# Patient Record
Sex: Male | Born: 1985 | Race: White | Hispanic: Yes | Smoking: Never smoker
Health system: Southern US, Community
[De-identification: ages and names within clinical notes are randomized; demographics above are authoritative.]

## PROBLEM LIST (undated history)

## (undated) DIAGNOSIS — K219 Gastro-esophageal reflux disease without esophagitis: Secondary | ICD-10-CM

## (undated) HISTORY — DX: Gastro-esophageal reflux disease without esophagitis: K21.9

---

## 2015-02-15 ENCOUNTER — Encounter: Payer: Self-pay | Admitting: Gastroenterology

## 2015-04-04 ENCOUNTER — Encounter: Payer: Self-pay | Admitting: Gastroenterology

## 2015-04-04 ENCOUNTER — Ambulatory Visit (INDEPENDENT_AMBULATORY_CARE_PROVIDER_SITE_OTHER): Payer: Self-pay | Admitting: Gastroenterology

## 2015-04-04 VITALS — BP 118/80 | HR 60 | Ht 63.5 in | Wt 146.5 lb

## 2015-04-04 DIAGNOSIS — Z8619 Personal history of other infectious and parasitic diseases: Secondary | ICD-10-CM

## 2015-04-04 DIAGNOSIS — R1013 Epigastric pain: Secondary | ICD-10-CM

## 2015-04-04 NOTE — Progress Notes (Signed)
HPI :  29 y/o male here for consultation for abdominal pain. History through translator present today.   He endorses epigastric pain. This has been bothering him for roughly 5-6 months or so. He reports it is present most days, but severity can fluctuate. He reports in the morning he endorses white phglem that comes up in his mouth but denies regurgitation of food. He also awakens with a bad taste in the mouth. Eating does not make it worse. He reports some nausea in the morning but no vomiting. Eating okay but he feels full easily, and not much appetite. No weight loss. He denies any problems with diarrhea, but passing some hard stools. He has one BM per day. No blood in the stools. He reports he was given pantoprazole q day, prior to meal by PCM. He does not think it helped much. He reports he was also given diclofenac for his abdominal pain, once daily, for one month. He stopped diclofenac 2 weeks ago. He has been on pantoprazole for roughly a few months and stopped it a week ago.    He has not had any other evaluation such as endoscopy or CT scan. He reports he had a CXR which appeared normal. He reports he has had some blood tests done which I have no records of today.  He states he was tested for H pylori and was treated for this, but his symptoms were unchanged. He denies any FH of stomach cancer. His father is with him today who had several questions.    No past medical history on file. No PMH. Reported H pylori   No past surgical history on file. None No family history on file. No GI malignancies known Social History  Substance Use Topics  . Smoking status: Never Smoker   . Smokeless tobacco: Never Used  . Alcohol Use: No   No current outpatient prescriptions on file.   No current facility-administered medications for this visit.   Allergies no known allergies   Review of Systems: All systems reviewed and negative except where noted in HPI.    No results found. [facility] labs [facility]  [facility]  Physical Exam: BP 118/80 mmHg  Pulse 60  Ht 5' 3.5" (1.613 m)  Wt 146 lb 8 oz (66.452 kg)  BMI 25.54 kg/m2 Constitutional: Pleasant,well-developed, male in no acute distress. HEENT: Normocephalic and atraumatic. Conjunctivae are normal. No scleral icterus. Neck supple.  Cardiovascular: Normal rate, regular rhythm.  Pulmonary/chest: Effort normal and breath sounds normal. No wheezing, rales or rhonchi. Abdominal: Soft, nondistended, mild epigastric TTP without rebound or guarding, negative Carnett sign. Bowel sounds active throughout. There are no masses palpable. No hepatomegaly. Extremities: no edema Lymphadenopathy: No cervical adenopathy noted. Neurological: Alert and oriented to person place and time. Skin: Skin is warm and dry. No rashes noted. Psychiatric: Normal mood and affect. Behavior is normal.   ASSESSMENT AND PLAN: 29 y/o male presenting with 5-6 months worth of intermittent epigastric pain as described above, as well as regurgitation / acid taste in the morning.   It appears he previously tested positive for H pylori but need to obtain records from his PCM to clarify what he had done. I will request all records including lab work and imaging. He has had a trial of protonix from what I can gather however did not seem to provide benefit. However, he also reports he was told to take diclofenac daily during this time which is confounding his response to PPI,  as PUD is on the differential and this could have made it worse. At this time, recommend we obtain his old records and lab work, and hold all NSAIDs. I will try him on  omeprazole daily as some patients respond to a switch in PPI in case his symptoms are related to esophagitis or GERD. DDx includes H pylori, PUD, esophagitis, celiac, other intraabdominal pathology. I don't think he has abdominal wall pain based on exam today, negative Carnett sign. If EGD is negative, will consider CT abdomen if  he has not had this done.   The indications, risks, and benefits of EGD were explained to the patient in detail. Risks include but are not limited to bleeding, perforation, adverse reaction to medications, and cardiopulmonary compromise. Sequelae include but are not limited to the possibility of surgery, hospitalization, and mortality. The patient verbalized understanding and wished to proceed. All questions answered, referred to scheduler. Further recommendations pending results of the exam.   Ileene Patrick, MD [facility] Pager [phone]

## 2015-04-04 NOTE — Patient Instructions (Signed)
You have been scheduled for an endoscopy. Please follow written instructions given to you at your visit today. If you use inhalers (even only as needed), please bring them with you on the day of your procedure. Your physician has requested that you go to www.startemmi.com and enter the access code given to you at your visit today. This web site gives a general overview about your procedure. However, you should still follow specific instructions given to you by our office regarding your preparation for the procedure.  We have sent the following medications to your pharmacy for you to pick up at your convenience: Protonix

## 2015-04-05 ENCOUNTER — Other Ambulatory Visit: Payer: Self-pay

## 2015-04-05 DIAGNOSIS — R109 Unspecified abdominal pain: Secondary | ICD-10-CM

## 2015-04-10 ENCOUNTER — Telehealth: Payer: Self-pay

## 2015-04-10 ENCOUNTER — Other Ambulatory Visit: Payer: Self-pay

## 2015-04-10 MED ORDER — OMEPRAZOLE 40 MG PO CPDR: 40.0000 mg | DELAYED_RELEASE_CAPSULE | Freq: Every day | ORAL | Status: DC

## 2015-04-10 NOTE — Telephone Encounter (Signed)
Called and reschedule pt for 04/12/2015 at 9:00am with Dr. Adela LankArmbruster.

## 2015-04-12 ENCOUNTER — Encounter: Payer: Self-pay | Admitting: Gastroenterology

## 2015-04-12 ENCOUNTER — Ambulatory Visit (AMBULATORY_SURGERY_CENTER): Payer: Self-pay | Admitting: Gastroenterology

## 2015-04-12 VITALS — BP 108/62 | HR 67 | Temp 97.4°F | Resp 20 | Ht 63.0 in | Wt 146.0 lb

## 2015-04-12 DIAGNOSIS — R1013 Epigastric pain: Secondary | ICD-10-CM

## 2015-04-12 MED ORDER — SODIUM CHLORIDE 0.9 % IV SOLN: 500.0000 mL | INTRAVENOUS | Status: DC

## 2015-04-12 NOTE — Progress Notes (Signed)
Everything well explained with interpretor pre- procedure To RR stable awake

## 2015-04-12 NOTE — Patient Instructions (Addendum)
Impressions/recommendations:  Gastritis (handout per Dr. Adela LankArmbruster)  YOU HAD AN ENDOSCOPIC PROCEDURE TODAY AT THE [facility]:   Refer to the procedure report that was given to you for any specific questions about what was found during the examination.  If the procedure report does not answer your questions, please call your gastroenterologist to clarify.  If you requested that your care partner not be given the details of your procedure findings, then the procedure report has been included in a sealed envelope for you to review at your convenience later.  YOU SHOULD EXPECT: Some feelings of bloating in the abdomen. Passage of more gas than usual.  Walking can help get rid of the air that was put into your GI tract during the procedure and reduce the bloating. If you had a lower endoscopy (such as a colonoscopy or flexible sigmoidoscopy) you may notice spotting of blood in your stool or on the toilet paper. If you underwent a bowel prep for your procedure, you may not have a normal bowel movement for a few days.  Please Note:  You might notice some irritation and congestion in your nose or some drainage.  This is from the oxygen used during your procedure.  There is no need for concern and it should clear up in a day or so.  SYMPTOMS TO REPORT IMMEDIATELY:   Following upper endoscopy (EGD)  Vomiting of blood or coffee ground material  New chest pain or pain under the shoulder blades  Painful or persistently difficult swallowing  New shortness of breath  Fever of 100F or higher  Black, tarry-looking stools  For urgent or emergent issues, a gastroenterologist can be reached at any hour by calling [phone].   DIET: Your first meal following the procedure should be a small meal and then it is ok to progress to your normal diet. Heavy or fried foods are harder to digest and may make you feel nauseous or bloated.  Likewise, meals heavy in dairy and vegetables can increase  bloating.  Drink plenty of fluids but you should avoid alcoholic beverages for 24 hours.  ACTIVITY:  You should plan to take it easy for the rest of today and you should NOT DRIVE or use heavy machinery until tomorrow (because of the sedation medicines used during the test).    FOLLOW UP: Our staff will call the number listed on your records the next business day following your procedure to check on you and address any questions or concerns that you may have regarding the information given to you following your procedure. If we do not reach you, we will leave a message.  However, if you are feeling well and you are not experiencing any problems, there is no need to return our call.  We will assume that you have returned to your regular daily activities without incident.  If any biopsies were taken you will be contacted by phone or by letter within the next 1-3 weeks.  Please call us at [phone] if you have not heard about the biopsies in 3 weeks.    SIGNATURES/CONFIDENTIALITY: You and/or your care partner have signed paperwork which will be entered into your electronic medical record.  These signatures attest to the fact that that the information above on your After Visit Summary has been reviewed and is understood.  Full responsibility of the confidentiality of this discharge information lies with you and/or your care-partner.

## 2015-04-12 NOTE — Op Note (Signed)
[facility] [address] [place], [id number]   ENDOSCOPY PROCEDURE REPORT  PATIENT: Javier Ward, Tenzin  MR#: [id number] BIRTHDATE: 1986-03-28 , 29  yrs. old GENDER: male ENDOSCOPIST: Benancio DeedsSteven P Orval Dortch, MD REFERRED BY: PROCEDURE DATE:  04/12/2015 PROCEDURE:  EGD w/ biopsy ASA CLASS:     Class II INDICATIONS:  epigastric pain. MEDICATIONS: Propofol 200 mg IV and Lidocaine 40 mg IV TOPICAL ANESTHETIC:  DESCRIPTION OF PROCEDURE: After the risks benefits and alternatives of the procedure were thoroughly explained, informed consent was obtained.  The LB [id number] endoscope was introduced through the mouth and advanced to the second portion of the duodenum , Without limitations.  The instrument was slowly withdrawn as the mucosa was fully examined.  FINDINGS: The esophagus was normal in appearance.  DH, GEJ, and SCJ located 38cm from the incisors.  The stomach was notable for an erythematous patch of mucosa in the gastric body without focal ulceration or erosion.  The rest of the stomach appeared normal. Biopsies taken of the antrum and body to rule out H pylori.  The dudoenal bulb was normal.  A few small erosions were noted in the 2nd portion of the duodenum without focal ulceration.  Biopsies taken to rule out celiac disease.  Retroflexed views revealed no abnormalities.     The scope was then withdrawn from the patient and the procedure completed.  COMPLICATIONS: There were no immediate complications.  ENDOSCOPIC IMPRESSION: Normal esophagus Gastric erythema as described above, biopsies taken to rule out H pylori Small erosions in the 2nd portion duodenum, biopsies obtained      RECOMMENDATIONS: Resume diet Resume medications. Please go to the pharmacy to pick up omeprazole as previously recommended No NSAIDs Await pathology results.    eSigned:  Benancio DeedsSteven P Lakaya Tolen, MD 04/12/2015 10:18 AM    CC: the patient  PATIENT NAME:   Javier Ward, Zanden MR#: [id number]

## 2015-04-12 NOTE — Progress Notes (Signed)
Called to room to assist during endoscopic procedure.  Patient ID and intended procedure confirmed with present staff. Received instructions for my participation in the procedure from the performing physician.  

## 2015-04-13 ENCOUNTER — Telehealth: Payer: Self-pay | Admitting: *Deleted

## 2015-04-13 NOTE — Telephone Encounter (Signed)
  Follow up Call-  Call back number 04/12/2015  Post procedure Call Back phone  # [phone]- Javier NovemberMike his firiends #, he speaks english  Permission to leave phone message Yes     Patient questions:  Do you have a fever, pain , or abdominal swelling? No. Pain Score  0 *  Have you tolerated food without any problems? Yes.    Have you been able to return to your normal activities? Yes.    Do you have any questions about your discharge instructions: Diet   No. Medications  No. Follow up visit  No.  Do you have questions or concerns about your Care? No.  Actions: * If pain score is 4 or above: No action needed, pain <4.

## 2015-04-18 ENCOUNTER — Other Ambulatory Visit: Payer: Self-pay | Admitting: *Deleted

## 2015-04-18 ENCOUNTER — Telehealth: Payer: Self-pay | Admitting: Gastroenterology

## 2015-04-18 DIAGNOSIS — R1013 Epigastric pain: Secondary | ICD-10-CM

## 2015-04-18 NOTE — Telephone Encounter (Signed)
Spoke with Kathlene NovemberMike and gave him date and time of CT. He will pick up contrast and instructions for patient.

## 2015-04-20 ENCOUNTER — Other Ambulatory Visit: Payer: Self-pay

## 2015-04-20 ENCOUNTER — Ambulatory Visit (INDEPENDENT_AMBULATORY_CARE_PROVIDER_SITE_OTHER)
Admission: RE | Admit: 2015-04-20 | Discharge: 2015-04-20 | Disposition: A | Discharge status: Home / Self Care | Payer: Self-pay | Source: Ambulatory Visit | Attending: Gastroenterology | Admitting: Gastroenterology

## 2015-04-20 DIAGNOSIS — R1084 Generalized abdominal pain: Secondary | ICD-10-CM

## 2015-04-20 DIAGNOSIS — R1013 Epigastric pain: Secondary | ICD-10-CM

## 2015-04-20 MED ORDER — IOHEXOL 300 MG/ML  SOLN
100.0000 mL | Freq: Once | INTRAMUSCULAR | Status: AC | PRN
  Administered 2015-04-20: 100 mL via INTRAVENOUS

## 2015-04-21 ENCOUNTER — Encounter: Payer: Self-pay | Admitting: Gastroenterology

## 2015-04-21 ENCOUNTER — Other Ambulatory Visit (INDEPENDENT_AMBULATORY_CARE_PROVIDER_SITE_OTHER): Payer: Self-pay

## 2015-04-21 ENCOUNTER — Ambulatory Visit (INDEPENDENT_AMBULATORY_CARE_PROVIDER_SITE_OTHER): Payer: Self-pay | Admitting: Gastroenterology

## 2015-04-21 DIAGNOSIS — R1084 Generalized abdominal pain: Secondary | ICD-10-CM

## 2015-04-21 DIAGNOSIS — R1013 Epigastric pain: Secondary | ICD-10-CM

## 2015-04-21 DIAGNOSIS — R109 Unspecified abdominal pain: Secondary | ICD-10-CM

## 2015-04-21 DIAGNOSIS — G8929 Other chronic pain: Secondary | ICD-10-CM | POA: Insufficient documentation

## 2015-04-21 LAB — CBC WITH DIFFERENTIAL/PLATELET
BASOS PCT: 0.6 % (ref 0.0–3.0)
Basophils Absolute: 0 10*3/uL (ref 0.0–0.1)
EOS ABS: 0.1 10*3/uL (ref 0.0–0.7)
EOS PCT: 2.4 % (ref 0.0–5.0)
HEMATOCRIT: 46 % (ref 39.0–52.0)
HEMOGLOBIN: 15.4 g/dL (ref 13.0–17.0)
LYMPHS PCT: 32.7 % (ref 12.0–46.0)
Lymphs Abs: 2 10*3/uL (ref 0.7–4.0)
MCHC: 33.5 g/dL (ref 30.0–36.0)
MCV: 79.4 fl (ref 78.0–100.0)
MONOS PCT: 8.1 % (ref 3.0–12.0)
Monocytes Absolute: 0.5 10*3/uL (ref 0.1–1.0)
NEUTROS ABS: 3.4 10*3/uL (ref 1.4–7.7)
Neutrophils Relative %: 56.2 % (ref 43.0–77.0)
PLATELETS: 319 10*3/uL (ref 150.0–400.0)
RBC: 5.8 Mil/uL (ref 4.22–5.81)
RDW: 13.6 % (ref 11.5–15.5)
WBC: 6.1 10*3/uL (ref 4.0–10.5)

## 2015-04-21 LAB — COMPREHENSIVE METABOLIC PANEL
ALBUMIN: 4.7 g/dL (ref 3.5–5.2)
ALT: 22 U/L (ref 0–53)
AST: 17 U/L (ref 0–37)
Alkaline Phosphatase: 74 U/L (ref 39–117)
BUN: 21 mg/dL (ref 6–23)
CALCIUM: 9.7 mg/dL (ref 8.4–10.5)
CHLORIDE: 106 meq/L (ref 96–112)
CO2: 28 meq/L (ref 19–32)
CREATININE: 1.06 mg/dL (ref 0.40–1.50)
GFR: 88.97 mL/min (ref >60.00)
Glucose, Bld: 105 mg/dL — ABNORMAL HIGH (ref 70–99)
POTASSIUM: 4.3 meq/L (ref 3.5–5.1)
Sodium: 140 mEq/L (ref 135–145)
Total Bilirubin: 0.5 mg/dL (ref 0.2–1.2)
Total Protein: 7.7 g/dL (ref 6.0–8.3)

## 2015-04-21 LAB — LIPASE: LIPASE: 32 U/L (ref 11.0–59.0)

## 2015-04-21 LAB — C-REACTIVE PROTEIN: CRP: 0.4 mg/dL — AB (ref 0.5–20.0)

## 2015-04-21 LAB — SEDIMENTATION RATE: SED RATE: 7 mm/h (ref 0–22)

## 2015-04-21 LAB — IGA: IgA: 370 mg/dL (ref 68–378)

## 2015-04-21 MED ORDER — POLYETHYLENE GLYCOL 3350 17 GM/SCOOP PO POWD
ORAL | Status: AC
Start: 2015-04-21 — End: ?

## 2015-04-21 MED ORDER — TRAMADOL HCL 50 MG PO TABS: 50.0000 mg | ORAL_TABLET | Freq: Three times a day (TID) | ORAL | Status: AC | PRN

## 2015-04-21 MED ORDER — LIDOCAINE 5 % EX PTCH: 1.0000 | MEDICATED_PATCH | CUTANEOUS | Status: AC

## 2015-04-21 NOTE — Progress Notes (Signed)
HPI :  INTAKE VISIT 29 y/o male here for consultation for abdominal pain. History through translator present today.   He endorses epigastric pain. This has been bothering him for roughly 5-6 months or so. He reports it is present most days, but severity can fluctuate. He reports in the morning he endorses white phglem that comes up in his mouth but denies regurgitation of food. He also awakens with a bad taste in the mouth. Eating does not make it worse. He reports some nausea in the morning but no vomiting. Eating okay but he feels full easily, and not much appetite. No weight loss. He denies any problems with diarrhea, but passing some hard stools. He has one BM per day. No blood in the stools. He reports he was given pantoprazole q day, prior to meal by PCM. He does not think it helped much. He reports he was also given diclofenac for his abdominal pain, once daily, for one month. He stopped diclofenac 2 weeks ago. He has been on pantoprazole for roughly a few months and stopped it a week ago.   He has not had any other evaluation such as endoscopy or CT scan. He reports he had a CXR which appeared normal. He reports he has had some blood tests done which I have no records of today. He states he was tested for H pylori and was treated for this, but his symptoms were unchanged. He denies any FH of stomach cancer. His father is with him today who had several questions.   SINCE LAST VISIT  Patient here for follow up, after having EGD and CT abdomen. Since the last visit he feels about the same. Symptoms have been ongoing for 5-6 months or so now total but seems worse recently. Trial of omeprazole has not provided too much, maybe a little. Stable pain, sometimes pain is worse and having a hard time sleeping. Pain is in the upper abdomen, in the epigastric area mostly, pain can be worse with eating but not always, he is unclear if there is an association. No vomiting. In the morning he has spitting up  saliva/secretions but no blood or mucous. No dysphagia. No odynophagia. He has pain present all the time, 24/7, never goes away. Pain is rated 8/10 at max, baseline rated 5/10. He has some early satiety, although states he does not want to eat in case it makes his symptoms worse. He is constipated at baseline, has one BM per day, he strains to move his bowels. No blood in the stools. After a bowel movement his pain is not much better.    EGD - 04/12/15 - mild gastritis, biopsies show negative for H pylori Ct abdomen - increased stool burden, small 48m nodule in lower lobe, benign per radiology     Past Medical History  Diagnosis Date  . GERD (gastroesophageal reflux disease)      History reviewed. No pertinent past surgical history. Family History  Problem Relation Age of Onset  . Colon cancer Neg Hx   . Esophageal cancer Neg Hx   . Stomach cancer Neg Hx    Social History  Substance Use Topics  . Smoking status: Never Smoker   . Smokeless tobacco: Never Used  . Alcohol Use: No   Current Outpatient Prescriptions  Medication Sig Dispense Refill  . omeprazole (PRILOSEC) 40 MG capsule Take 1 capsule (40 mg total) by mouth daily. 60 capsule 1   No current facility-administered medications for this visit.   No  Known Allergies   Review of Systems: All systems reviewed and negative except where noted in HPI.    Ct Abdomen Pelvis W Contrast  04/20/2015  CLINICAL DATA:  Epigastric pain for several months. Increased recently. Constipation. EXAM: CT ABDOMEN AND PELVIS WITH CONTRAST TECHNIQUE: Multidetector CT imaging of the abdomen and pelvis was performed using the standard protocol following bolus administration of intravenous contrast. Oral contrast was also administered. CONTRAST:  151m OMNIPAQUE IOHEXOL 300 MG/ML  SOLN COMPARISON:  None. FINDINGS: Lower chest: There is a 5 mm nodular opacity in the medial segment of the right middle lobe. Lung bases are otherwise clear.  Hepatobiliary: No focal liver lesions are identified. Gallbladder wall is not appreciably thickened. There is no biliary duct dilatation. Pancreas: No pancreatic mass or inflammatory focus. Spleen: No splenic lesions are identified. Adrenals/Urinary Tract: Adrenals appear normal bilaterally. Kidneys bilaterally show no mass or hydronephrosis. There is no renal or ureteral calculus on either side. Urinary bladder is midline with wall thickness within normal limits. Stomach/Bowel: There is fairly diffuse stool throughout the colon. The colon is not distended with stool. There is no bowel wall or mesenteric thickening. No bowel obstruction. No free air or portal venous air. Vascular/Lymphatic: There is no abdominal aortic aneurysm. No vascular lesions are identified. The major mesenteric arteries appear patent. No adenopathy is appreciable in the abdomen or pelvis. Reproductive: Prostate is normal in size and configuration. There is no pelvic mass or pelvic fluid collection. Other: Appendix appears normal. There is no abscess or ascites in the abdomen or pelvis. Musculoskeletal: There are no blastic or lytic bone lesions. There is no intramuscular or abdominal wall lesion. IMPRESSION: Stool throughout colon without colonic distention due to stool. No bowel obstruction. No abscess. Appendix appears normal. No renal or ureteral calculus.  No hydronephrosis. 5 mm nodular opacity medial segment right middle lobe. In this age group, this finding is most likely benign. Electronically Signed   By: WLowella GripIII M.D.   On: 04/20/2015 10:32    Physical Exam: BP 112/76 mmHg  Pulse 72  Ht 5' 3.5" (1.613 m)  Wt 144 lb 6.4 oz (65.499 kg)  BMI 25.17 kg/m2 Constitutional: Pleasant,well-developed, male in no acute distress. HEENT: Normocephalic and atraumatic. Conjunctivae are normal. No scleral icterus. Neck supple.  Cardiovascular: Normal rate, regular rhythm.  Pulmonary/chest: Effort normal and breath sounds  normal. No wheezing, rales or rhonchi. Abdominal: Soft, nondistended, epigastric tenderness to palpation along costal margin with positive Carnett sign,. Bowel sounds active throughout. There are no masses palpable. No hepatomegaly. Extremities: no edema Lymphadenopathy: No cervical adenopathy noted. Neurological: Alert and oriented to person place and time. Skin: Skin is warm and dry. No rashes noted. Psychiatric: Normal mood and affect. Behavior is normal.   ASSESSMENT AND PLAN: 29y/o male with 5-6 months of ongoing epigastric pain as outlined above. Pain at this time is present 24/7, waxes and wanes in severity and tender to the touch, no clear reliable relation to eating perhaps sometimes, and with some early satiety. PPIs have not provided benefit. EGD showed mild nonspecific HP negative gastritis, biopses negative for celiac. CT abdomen was normal other than increased stool burden, no clear pathology noted to cause his pain. His pain on exam today was reproducible and he had a positive Carnett sign. I suspect a strong component of his pain may be due to musculoskeletal / abdominal wall pain, although he does have some ? postprandial component which would be a bit atypical.  Will try  him on a short course of Ultram and provide lidocaine patches to the abdominal wall to see if this helps. He otherwise has reported blood work from his [facility] but we were not able to obtain the results. I asked him to go to the lab for basic lab draw for CBC, CMP, ESR, CRP, lipase, and celiac serology. Otherwise, he has some mild constipation and recommend miralax daily to see if this helps, but do not think his symptoms are being driven by constipation. I reassured him regarding CT and EGD findings. I do think he has a strong component of abdominal wall pain driving this picture and we will see how he responds to this regimen, and await labs to ensure normal. Otherwise he can try some FD Donald Prose for possible dyspepsia as well  to see if this helps. If symptoms persist may consider gastric emptying scan and pain management consult for trigger point injection. He agreed.   Of note, small lung nodule noted on CT. Not related to his presentation but recommend he see his PCM about this to determine if any long term follow up for this is warranted. It appears benign per radiology. He agreed.   Bunk Foss Cellar, MD [facility] Pager [phone]

## 2015-04-21 NOTE — Patient Instructions (Signed)
We have sent  medications to your pharmacy for you to pick up at your convenience.  Your physician has requested that you go to the basement for lab work before leaving today.     

## 2015-04-24 LAB — TISSUE TRANSGLUTAMINASE, IGA: TISSUE TRANSGLUTAMINASE AB, IGA: 1 U/mL (ref <4)

## 2015-06-19 ENCOUNTER — Encounter: Payer: Self-pay | Admitting: Gastroenterology

## 2015-07-25 ENCOUNTER — Encounter: Payer: Self-pay | Admitting: Gastroenterology

## 2015-07-25 ENCOUNTER — Telehealth: Payer: Self-pay | Admitting: Gastroenterology

## 2015-07-25 ENCOUNTER — Ambulatory Visit (INDEPENDENT_AMBULATORY_CARE_PROVIDER_SITE_OTHER): Payer: Self-pay | Admitting: Gastroenterology

## 2015-07-25 VITALS — BP 116/70 | HR 64 | Ht 63.5 in | Wt 152.4 lb

## 2015-07-25 DIAGNOSIS — R0989 Other specified symptoms and signs involving the circulatory and respiratory systems: Secondary | ICD-10-CM

## 2015-07-25 DIAGNOSIS — R05 Cough: Secondary | ICD-10-CM

## 2015-07-25 DIAGNOSIS — R059 Cough, unspecified: Secondary | ICD-10-CM

## 2015-07-25 DIAGNOSIS — F458 Other somatoform disorders: Secondary | ICD-10-CM

## 2015-07-25 MED ORDER — OMEPRAZOLE 40 MG PO CPDR: 40.0000 mg | DELAYED_RELEASE_CAPSULE | Freq: Two times a day (BID) | ORAL | Status: AC

## 2015-07-25 NOTE — Patient Instructions (Signed)
We have sent the following medications to your pharmacy for you to pick up at your convenience: Omeprazole 40mg.   

## 2015-07-25 NOTE — Progress Notes (Signed)
HPI :  INTAKE VISIT 30 y/o male here for consultation for abdominal pain. History through translator present today.   He endorses epigastric pain. This has been bothering him for roughly 5-6 months or so. He reports it is present most days, but severity can fluctuate. He reports in the morning he endorses white phglem that comes up in his mouth but denies regurgitation of food. He also awakens with a bad taste in the mouth. Eating does not make it worse. He reports some nausea in the morning but no vomiting. Eating okay but he feels full easily, and not much appetite. No weight loss. He denies any problems with diarrhea, but passing some hard stools. He has one BM per day. No blood in the stools. He reports he was given pantoprazole q day, prior to meal by PCM. He does not think it helped much. He reports he was also given diclofenac for his abdominal pain, once daily, for one month. He stopped diclofenac 2 weeks ago. He has been on pantoprazole for roughly a few months and stopped it a week ago.   He has not had any other evaluation such as endoscopy or CT scan. He reports he had a CXR which appeared normal. He reports he has had some blood tests done which I have no records of today. He states he was tested for H pylori and was treated for this, but his symptoms were unchanged. He denies any FH of stomach cancer. His father is with him today who had several questions.   LAST VISIT  Patient here for follow up, after having EGD and CT abdomen. Since the last visit he feels about the same. Symptoms have been ongoing for 5-6 months or so now total but seems worse recently. Trial of omeprazole has not provided too much, maybe a little. Stable pain, sometimes pain is worse and having a hard time sleeping. Pain is in the upper abdomen, in the epigastric area mostly, pain can be worse with eating but not always, he is unclear if there is an association. No vomiting. In the morning he has spitting up  saliva/secretions but no blood or mucous. No dysphagia. No odynophagia. He has pain present all the time, 24/7, never goes away. Pain is rated 8/10 at max, baseline rated 5/10. He has some early satiety, although states he does not want to eat in case it makes his symptoms worse. He is constipated at baseline, has one BM per day, he strains to move his bowels. No blood in the stools. After a bowel movement his pain is not much better.    EGD - 04/12/15 - mild gastritis, biopsies show negative for H pylori Ct abdomen - increased stool burden, small 5mm nodule in lower lobe, benign per radiology  SINCE LAST VISIT:  He is here for follow up. I thought he may have had abdominal wall pain following our last visit and recommend a short course of Ultram to use PRN, and then trial of lidocaine patches and FD guard for possible component of functional dyspepsia. He had negative lab testing for celiac disease. He reports his pain has resolved since I have seen him and this is no longer bothering him at all. He completed the course of lidocaine and ultram and he thinks the pain has gone away for the past month or so and he is feeling quite well in this regard.   He otherwise is bothered by morning symptoms of waking up with severe cough and spitting  up "white foam". He denies more classic symptoms of heartburn / pyrosis. He feels like he has globus, sense of something in his throat, when he wakes up every day. He has cough mostly in the morning to relive this sensation, but usually does not have it at all later in the day. He denies dysphagia. No odynophagia. He reports symptoms every day. He is not waking up in the middle of the night due to symptoms. He denies any pyrosis at baseline, perhaps when he coughs. He denies acid taste in the mouth when he wakes up. He reports he ran out of omeprazole 40mg  about 6 weeks ago. He ran out of his medications. He eats dinner at 9 PM, goes to bed at 12 PM.   Of note, the  patient has a family friend named Kathlene November who has been accompanying him to his visits to provide translation. He was rather upset today as he has reported they have tried calling me for medication refills and reassessment but has not been able to reach me. This is the first time I have heard of Kathlene November or the patient being upset about inability to call me on the phone.   Past Medical History  Diagnosis Date  . GERD (gastroesophageal reflux disease)      History reviewed. No pertinent past surgical history.   Family History  Problem Relation Age of Onset  . Colon cancer Neg Hx   . Esophageal cancer Neg Hx   . Stomach cancer Neg Hx    Social History  Substance Use Topics  . Smoking status: Never Smoker   . Smokeless tobacco: Never Used  . Alcohol Use: No   Current Outpatient Prescriptions  Medication Sig Dispense Refill  . lidocaine (LIDODERM) 5 % Place 1 patch onto the skin daily. Remove & Discard patch within 12 hours or as directed by MD 30 patch 0  . omeprazole (PRILOSEC) 40 MG capsule Take 1 capsule (40 mg total) by mouth daily. 60 capsule 1  . polyethylene glycol powder (GLYCOLAX/MIRALAX) powder Take 17 grams daily. 255 g 3  . traMADol (ULTRAM) 50 MG tablet Take 1 tablet (50 mg total) by mouth every 8 (eight) hours as needed. 30 tablet 1   No current facility-administered medications for this visit.   No Known Allergies   Review of Systems: All systems reviewed and negative except where noted in HPI.    Lab Results  Component Value Date   WBC 6.1 04/21/2015   HGB 15.4 04/21/2015   HCT 46.0 04/21/2015   MCV 79.4 04/21/2015   PLT 319.0 04/21/2015    Lab Results  Component Value Date   CREATININE 1.06 04/21/2015   BUN 21 04/21/2015   NA 140 04/21/2015   K 4.3 04/21/2015   CL 106 04/21/2015   CO2 28 04/21/2015    Lab Results  Component Value Date   ALT 22 04/21/2015   AST 17 04/21/2015   ALKPHOS 74 04/21/2015   BILITOT 0.5 04/21/2015    Lab Results    Component Value Date   ESRSEDRATE 7 04/21/2015    Lab Results  Component Value Date   CRP 0.4* 04/21/2015      Physical Exam: BP 116/70 mmHg  Pulse 64  Ht 5' 3.5" (1.613 m)  Wt 152 lb 6 oz (69.117 kg)  BMI 26.57 kg/m2 Constitutional: Pleasant,well-developed, male in no acute distress. HEENT: Normocephalic and atraumatic. Conjunctivae are normal. No scleral icterus. Neck supple.  No thyromegaly or nodules appreciated. No lymphadenopathy Cardiovascular:  Normal rate, regular rhythm.  Pulmonary/chest: Effort normal and breath sounds normal. No wheezing, rales or rhonchi. Abdominal: Soft, nondistended, nontender. Bowel sounds active throughout. There are no masses palpable. No hepatomegaly. Extremities: no edema Lymphadenopathy: No cervical adenopathy noted. Neurological: Alert and oriented to person place and time. Skin: Skin is warm and dry. No rashes noted. Psychiatric: Normal mood and affect. Behavior is normal.   ASSESSMENT AND PLAN: 30 y/o male who previously had > 6 months of ongoing epigastric pain as outlined above. He had a negative CT abdomen and EGD. Labs were normal. I treated him for abdominal wall pain with lidocaine patches and his pain has intervally resolved and no longer bothering him which is great. He otherwise now complains of sense of globus in the morning associated with coughing up what sounds like normal saliva / secretions, although this does not bother him at any other time. He has no dysphagia. I discussed differential for him with this. It is possible he is having nocturnal GERD causing these early AM symptoms, and would recommend a trial of high dose omeprazole  BID for 2-3 weeks to see if this helps at all. I also counseled him to avoid eating within 4 hours of bedtime, and to sleep with the head of the bed elevated. If this does not help may consider ENT evaluation.   I recommend we touch base in about 3 weeks or so to see how he is doing. I will  place a reminder to call them however I emphasized if he calls our clinic to ask to speak with me directly or Rene Kocher, our nurse, who can relay a messages to me. They agreed. They asked me to contact Kathlene November in the future about the patient, his phone is [phone].  Ileene Patrick, MD [facility] Pager [phone]

## 2015-07-25 NOTE — Telephone Encounter (Signed)
Called pt and answered questions about the rx.

## 2015-08-14 ENCOUNTER — Telehealth: Payer: Self-pay | Admitting: Gastroenterology

## 2015-08-14 NOTE — Telephone Encounter (Signed)
As per the patient's request during our last visit I called his friend Kathlene NovemberMike, who interprets for him. He has been on prilosec 40mg  BID for 3 weeks for globus and possible reflux, and no reported benefit to date. He has since developed "diffuse body pain" all over, not abdomen, which is new. Kathlene NovemberMike has asked me to contact Dr. Jonny RuizGiovanni Llibre to further discuss with him about the next step in his case, as I don't clearly understand the symptoms they are reporting to me tonight. I will touch base with Dr. Jordan HawksLlibre and get back to him.

## 2015-08-15 NOTE — Telephone Encounter (Signed)
Javier KocherRegina Ward have tried reaching out to Dr. Marny LowensteinGiovanni Ward's office to discuss this patient with him, but got an answering phone message. [name] have is [phone]. Do you have any good phone number so Ward can contact this provider? Thanks

## 2015-08-15 NOTE — Telephone Encounter (Signed)
I do not have a number for this MD.

## 2015-08-16 NOTE — Telephone Encounter (Signed)
Spoke with Kathlene NovemberMike and he states he will try to get Dr. Jordan HawksLlibre to call Dr. Adela LankArmbruster. Explained that he will be out of the office on Thursday. He understands and will have MD call after that day. Unable to get Kathlene NovemberMike to answer questions about patient. He wants the doctors to talk to each other.

## 2015-08-16 NOTE — Telephone Encounter (Signed)
Rene KocherRegina I have not been able to get ahold of this physician. Can you please call Kathlene NovemberMike at the phone number listed in this patient's chart. He had previously asked me to call him, and when I called him he then asked me to call the primary care provider. I wanted to know if his globus or regurgitation was persisting despite PPI. If so, I think he may need an ENT evaluation for globus and was going to offer him a referral. He mentioned several other symptoms his Dr. Jordan HawksLlibre was evaluating that I don't think are related to his other prior GI symptoms he reported. Can you please let me know how they wish to proceed. Thanks

## 2015-08-23 NOTE — Telephone Encounter (Signed)
I called back Javier Ward and tried to ask about Javier Ward's condition as he has previously asked me. He said his symptoms of regurgitation / globus had persisted despite PPI. He tells me Javier Ward gave Javier Ward a medication to reduce his secretions, although unclear if this is helping. I tried to offer them a referral to ENT if globus is persisting but Javier Ward wanted to follow up with Javier Ward first and wants to get back to us. Javier [place], [place] be calling us back in the next few days. I was going to offer them an ENT consult if symptoms persist. Thanks

## 2015-08-29 ENCOUNTER — Telehealth: Payer: Self-pay | Admitting: Gastroenterology

## 2015-08-29 NOTE — Telephone Encounter (Signed)
I received a call from Dr. Jordan HawksLlibre about this patient. We discussed his case. He had no improvement on PPI, we both feel the patient's symptoms are more than likely functional. He was placed on Nortriptyline and has noted significant improvement in his symptoms with decreased secretions. I would continue this for now. Rene KocherRegina can you please call the patient's friend, Kathlene NovemberMike, and let him know we have spoken and would continue the regimen per Dr. Jordan HawksLlibre. Overall our impression is that he is improving. Thanks

## 2015-08-30 NOTE — Telephone Encounter (Signed)
Unable to reach patient's friend. Will try again later.

## 2015-08-30 NOTE — Telephone Encounter (Signed)
Patient's friend Kathlene NovemberMike notified of Dr. Lanetta InchArmbruster's recommendation.

## 2016-12-03 IMAGING — CT CT ABD-PELV W/ CM
2 of 4 series · 16 of 46 positions shown, 18 images · IV contrast (omnipaque)
Comparison: None.

CLINICAL DATA: Epigastric pain for several months. Increased
recently. Constipation.

EXAM:
CT ABDOMEN AND PELVIS WITH CONTRAST
TECHNIQUE: Multidetector CT imaging of the abdomen and pelvis was performed
using the standard protocol following bolus administration of
intravenous contrast. Oral contrast was also administered.
CONTRAST:  100mL OMNIPAQUE IOHEXOL 300 MG/ML  SOLN

[Series 2: abd/ pel 5mm · axial · 0.64mm/px · z∈[-460,-10]mm · 13 of 100 slices shown, 15 images]
[im 5/100  soft-tissue]
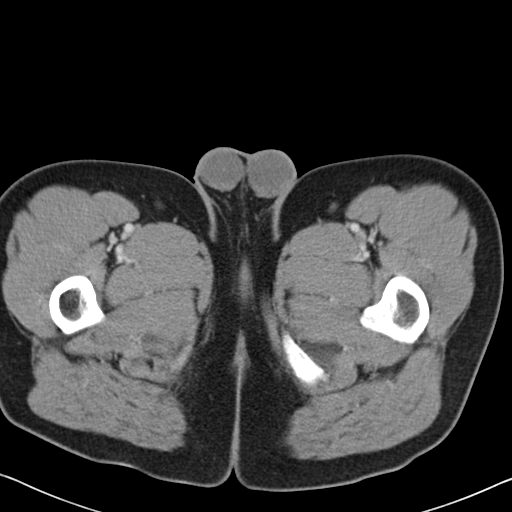
[im 5/100  bone]
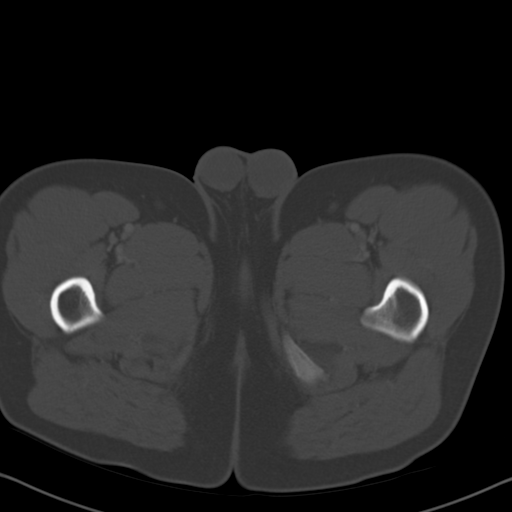
[im 13/100  soft-tissue]
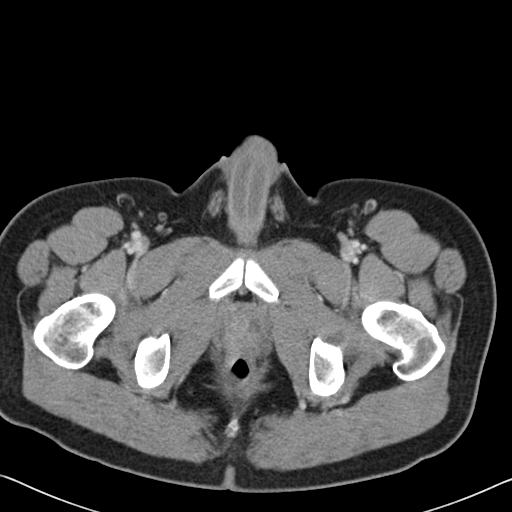
[im 21/100  soft-tissue]
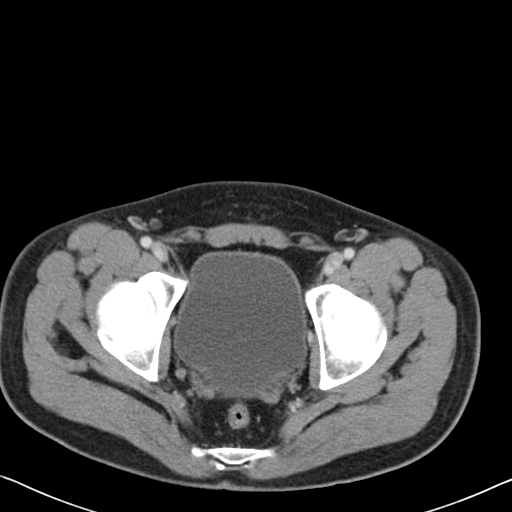
[im 29/100  soft-tissue]
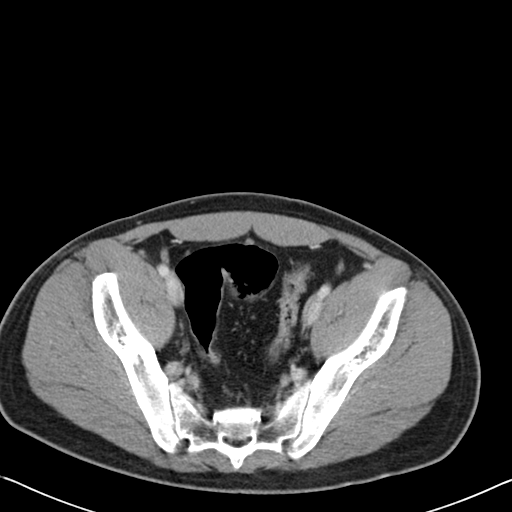
[im 34/100  soft-tissue]
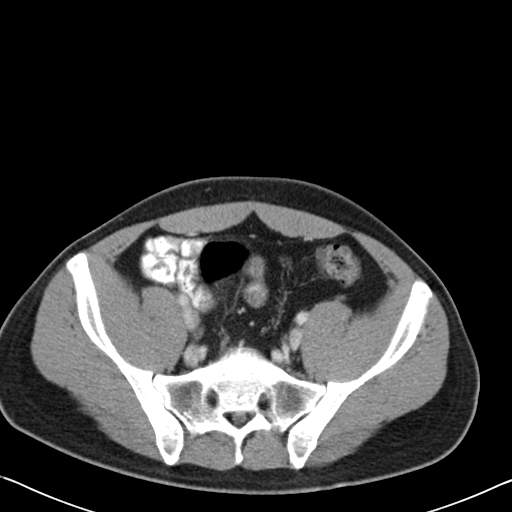
[im 42/100  soft-tissue]
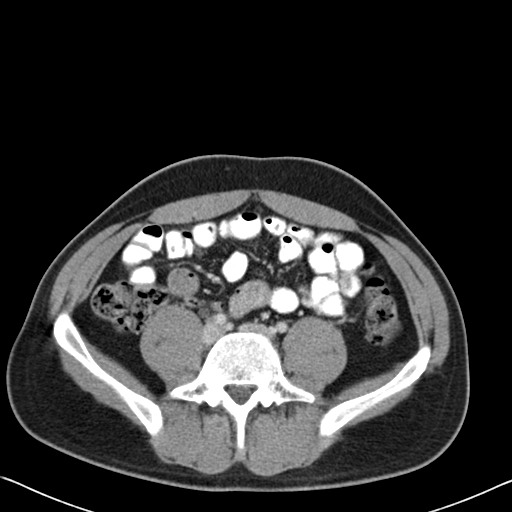
[im 50/100  soft-tissue]
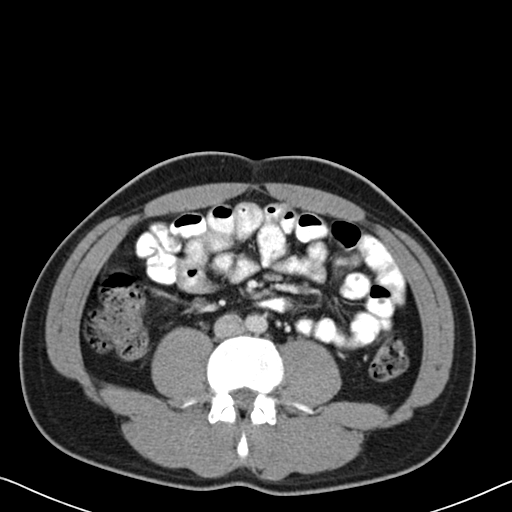
[im 58/100  soft-tissue]
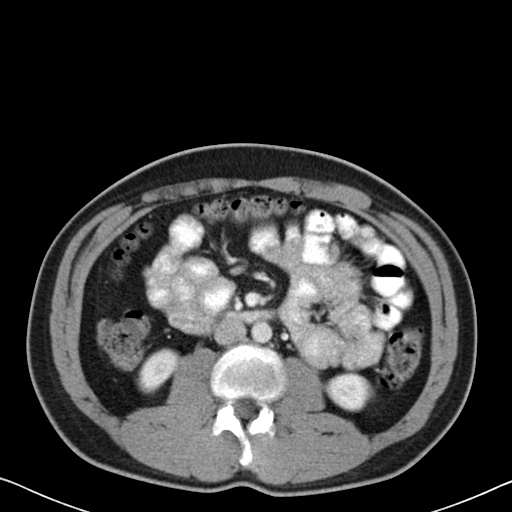
[im 67/100  soft-tissue]
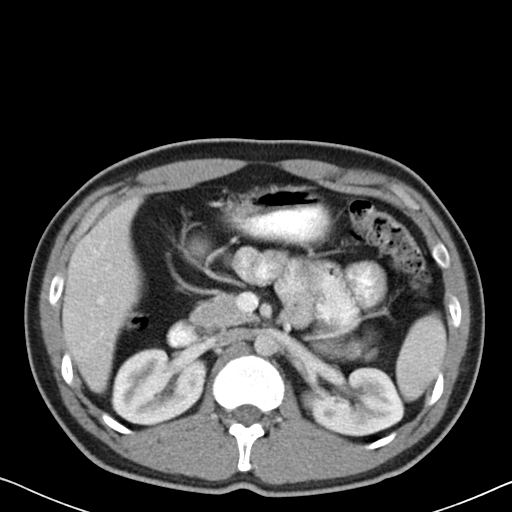
[im 67/100  bone]
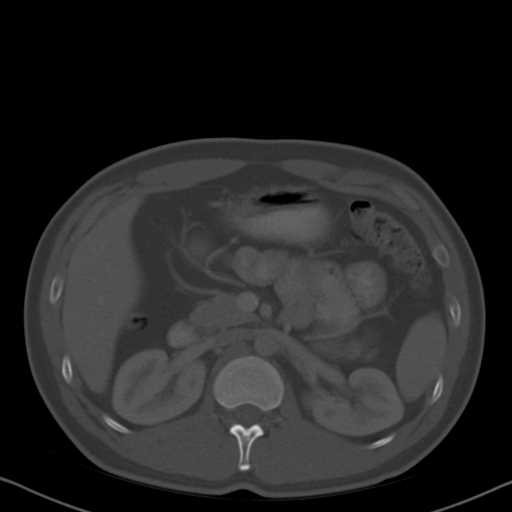
[im 71/100  soft-tissue]
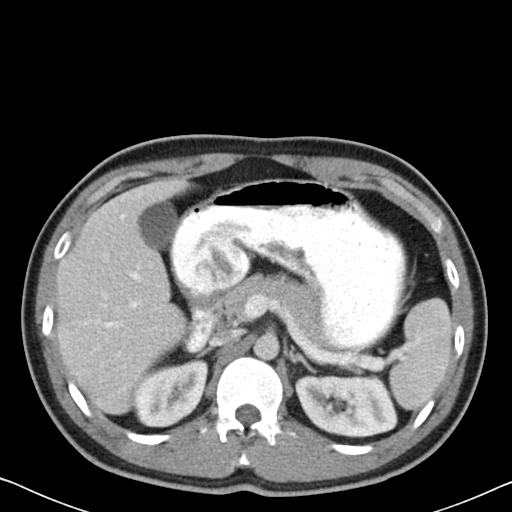
[im 79/100  soft-tissue]
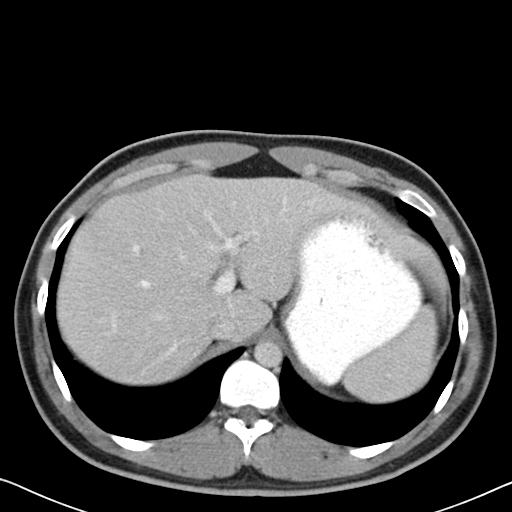
[im 87/100  soft-tissue]
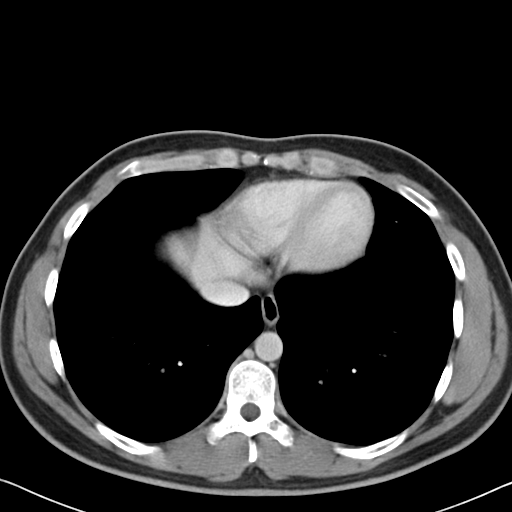
[im 95/100  soft-tissue]
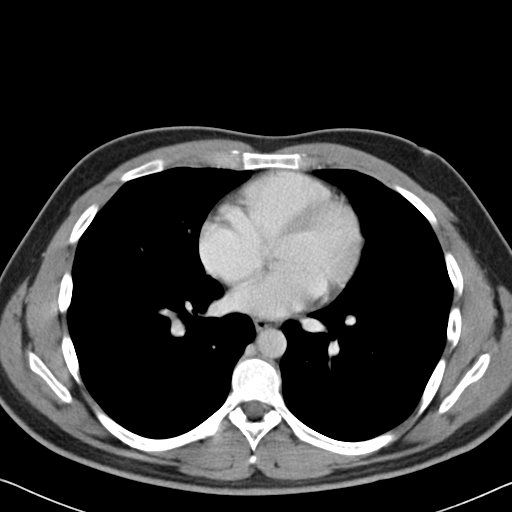

[Series 602: cor · coronal · 1.00mm/px · 3 of 111 slices shown]
[im 37/111  soft-tissue]
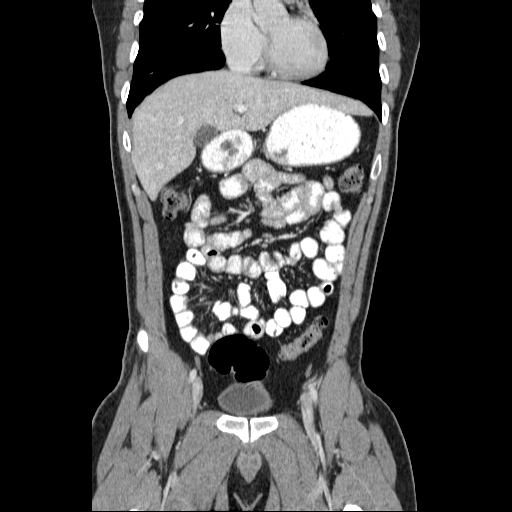
[im 49/111  soft-tissue]
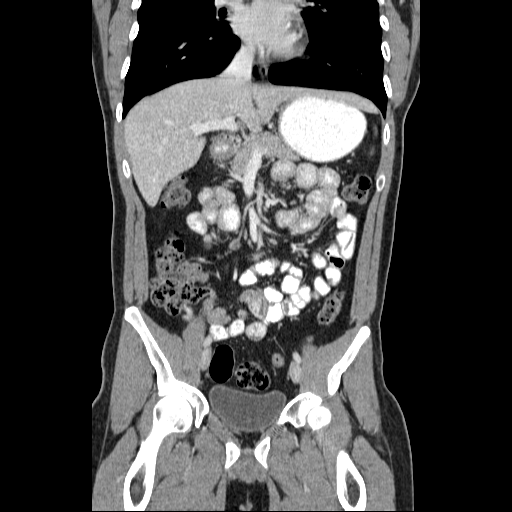
[im 62/111  soft-tissue]
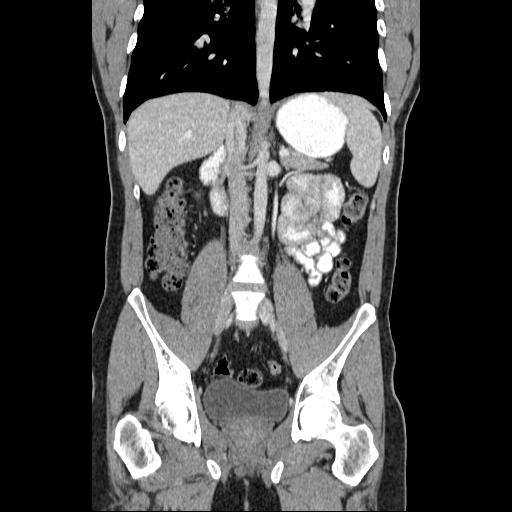

[16 of 46 positions shown; findings below may reference images not displayed]

FINDINGS: Lower chest: There is a 5 mm nodular opacity in the medial segment
of the right middle lobe. Lung bases are otherwise clear.

Hepatobiliary: No focal liver lesions are identified. Gallbladder
wall is not appreciably thickened. There is no biliary duct
dilatation.

Pancreas: No pancreatic mass or inflammatory focus.

Spleen: No splenic lesions are identified.

Adrenals/Urinary Tract: Adrenals appear normal bilaterally. Kidneys
bilaterally show no mass or hydronephrosis. There is no renal or
ureteral calculus on either side. Urinary bladder is midline with
wall thickness within normal limits.

Stomach/Bowel: There is fairly diffuse stool throughout the colon.
The colon is not distended with stool. There is no bowel wall or
mesenteric thickening. No bowel obstruction. No free air or portal
venous air.

Vascular/Lymphatic: There is no abdominal aortic aneurysm. No
vascular lesions are identified. The major mesenteric arteries
appear patent. No adenopathy is appreciable in the abdomen or
pelvis.

Reproductive: Prostate is normal in size and configuration. There is
no pelvic mass or pelvic fluid collection.

Other: Appendix appears normal. There is no abscess or ascites in
the abdomen or pelvis.

Musculoskeletal: There are no blastic or lytic bone lesions. There
is no intramuscular or abdominal wall lesion.
IMPRESSION: Stool throughout colon without colonic distention due to stool. No
bowel obstruction. No abscess.

Appendix appears normal.

No renal or ureteral calculus.  No hydronephrosis.

5 mm nodular opacity medial segment right middle lobe. In this age
group, this finding is most likely benign.
